# Patient Record
Sex: Female | Born: 1940 | Race: White | Hispanic: No | Smoking: Never smoker
Health system: Southern US, Community
[De-identification: ages and names within clinical notes are randomized; demographics above are authoritative.]

## PROBLEM LIST (undated history)

## (undated) DIAGNOSIS — M1990 Unspecified osteoarthritis, unspecified site: Secondary | ICD-10-CM

## (undated) DIAGNOSIS — C801 Malignant (primary) neoplasm, unspecified: Secondary | ICD-10-CM

## (undated) DIAGNOSIS — E079 Disorder of thyroid, unspecified: Secondary | ICD-10-CM

## (undated) DIAGNOSIS — J449 Chronic obstructive pulmonary disease, unspecified: Secondary | ICD-10-CM

## (undated) HISTORY — PX: CHOLECYSTECTOMY: SHX55

## (undated) HISTORY — PX: BREAST LUMPECTOMY: SHX2

## (undated) HISTORY — DX: Unspecified osteoarthritis, unspecified site: M19.90

## (undated) HISTORY — DX: Chronic obstructive pulmonary disease, unspecified: J44.9

## (undated) HISTORY — PX: COLON SURGERY: SHX602

## (undated) HISTORY — DX: Disorder of thyroid, unspecified: E07.9

## (undated) HISTORY — DX: Malignant (primary) neoplasm, unspecified: C80.1

## (undated) HISTORY — PX: SPINE SURGERY: SHX786

---

## 2004-03-14 ENCOUNTER — Inpatient Hospital Stay: Payer: Self-pay | Admitting: Surgery

## 2004-04-06 ENCOUNTER — Ambulatory Visit: Payer: Self-pay | Admitting: Radiation Oncology

## 2004-04-07 ENCOUNTER — Ambulatory Visit: Payer: Self-pay | Admitting: Radiation Oncology

## 2004-04-19 ENCOUNTER — Ambulatory Visit: Payer: Self-pay | Admitting: Surgery

## 2004-05-04 ENCOUNTER — Ambulatory Visit: Payer: Self-pay | Admitting: Radiation Oncology

## 2004-06-04 ENCOUNTER — Ambulatory Visit: Payer: Self-pay | Admitting: Radiation Oncology

## 2004-07-05 ENCOUNTER — Ambulatory Visit: Payer: Self-pay | Admitting: Radiation Oncology

## 2004-08-02 ENCOUNTER — Ambulatory Visit: Payer: Self-pay | Admitting: Radiation Oncology

## 2004-09-02 ENCOUNTER — Ambulatory Visit: Payer: Self-pay | Admitting: Radiation Oncology

## 2004-10-02 ENCOUNTER — Ambulatory Visit: Payer: Self-pay | Admitting: Radiation Oncology

## 2004-11-02 ENCOUNTER — Ambulatory Visit: Payer: Self-pay | Admitting: Radiation Oncology

## 2004-12-02 ENCOUNTER — Ambulatory Visit: Payer: Self-pay | Admitting: Radiation Oncology

## 2005-01-02 ENCOUNTER — Ambulatory Visit: Payer: Self-pay | Admitting: Radiation Oncology

## 2005-02-02 ENCOUNTER — Ambulatory Visit: Payer: Self-pay | Admitting: Internal Medicine

## 2005-03-04 ENCOUNTER — Ambulatory Visit: Payer: Self-pay | Admitting: Internal Medicine

## 2005-04-04 ENCOUNTER — Ambulatory Visit: Payer: Self-pay | Admitting: Internal Medicine

## 2005-05-10 ENCOUNTER — Ambulatory Visit: Payer: Self-pay | Admitting: Internal Medicine

## 2005-06-04 ENCOUNTER — Ambulatory Visit: Payer: Self-pay | Admitting: Internal Medicine

## 2005-07-05 ENCOUNTER — Ambulatory Visit: Payer: Self-pay | Admitting: Internal Medicine

## 2005-08-02 ENCOUNTER — Ambulatory Visit: Payer: Self-pay | Admitting: Internal Medicine

## 2005-08-22 ENCOUNTER — Ambulatory Visit: Payer: Self-pay | Admitting: Unknown Physician Specialty

## 2005-09-02 ENCOUNTER — Ambulatory Visit: Payer: Self-pay | Admitting: Internal Medicine

## 2005-10-04 ENCOUNTER — Ambulatory Visit: Payer: Self-pay | Admitting: Internal Medicine

## 2005-11-02 ENCOUNTER — Ambulatory Visit: Payer: Self-pay | Admitting: Internal Medicine

## 2005-12-02 ENCOUNTER — Ambulatory Visit: Payer: Self-pay | Admitting: Internal Medicine

## 2006-01-02 ENCOUNTER — Ambulatory Visit: Payer: Self-pay | Admitting: Internal Medicine

## 2006-02-02 ENCOUNTER — Ambulatory Visit: Payer: Self-pay | Admitting: Internal Medicine

## 2006-03-04 ENCOUNTER — Ambulatory Visit: Payer: Self-pay | Admitting: Internal Medicine

## 2006-04-04 ENCOUNTER — Ambulatory Visit: Payer: Self-pay | Admitting: Internal Medicine

## 2006-05-04 ENCOUNTER — Ambulatory Visit: Payer: Self-pay | Admitting: Internal Medicine

## 2006-06-04 ENCOUNTER — Ambulatory Visit: Payer: Self-pay | Admitting: Internal Medicine

## 2006-07-05 ENCOUNTER — Ambulatory Visit: Payer: Self-pay | Admitting: Internal Medicine

## 2006-08-03 ENCOUNTER — Ambulatory Visit: Payer: Self-pay | Admitting: Internal Medicine

## 2006-09-03 ENCOUNTER — Ambulatory Visit: Payer: Self-pay | Admitting: Internal Medicine

## 2006-09-25 ENCOUNTER — Ambulatory Visit: Payer: Self-pay | Admitting: Internal Medicine

## 2006-10-03 ENCOUNTER — Ambulatory Visit: Payer: Self-pay | Admitting: Internal Medicine

## 2006-11-03 ENCOUNTER — Ambulatory Visit: Payer: Self-pay | Admitting: Internal Medicine

## 2006-12-03 ENCOUNTER — Ambulatory Visit: Payer: Self-pay | Admitting: Internal Medicine

## 2006-12-25 ENCOUNTER — Ambulatory Visit: Payer: Self-pay | Admitting: Internal Medicine

## 2007-01-03 ENCOUNTER — Ambulatory Visit: Payer: Self-pay | Admitting: Internal Medicine

## 2007-02-03 ENCOUNTER — Ambulatory Visit: Payer: Self-pay | Admitting: Internal Medicine

## 2007-03-05 ENCOUNTER — Ambulatory Visit: Payer: Self-pay | Admitting: Internal Medicine

## 2007-04-05 ENCOUNTER — Ambulatory Visit: Payer: Self-pay | Admitting: Internal Medicine

## 2007-05-05 ENCOUNTER — Ambulatory Visit: Payer: Self-pay | Admitting: Internal Medicine

## 2007-06-05 ENCOUNTER — Ambulatory Visit: Payer: Self-pay | Admitting: Internal Medicine

## 2007-07-06 ENCOUNTER — Ambulatory Visit: Payer: Self-pay | Admitting: Internal Medicine

## 2007-08-03 ENCOUNTER — Ambulatory Visit: Payer: Self-pay | Admitting: Internal Medicine

## 2007-08-28 ENCOUNTER — Ambulatory Visit: Payer: Self-pay | Admitting: Unknown Physician Specialty

## 2007-09-03 ENCOUNTER — Ambulatory Visit: Payer: Self-pay | Admitting: Internal Medicine

## 2007-09-16 ENCOUNTER — Ambulatory Visit: Payer: Self-pay | Admitting: Internal Medicine

## 2007-10-03 ENCOUNTER — Ambulatory Visit: Payer: Self-pay | Admitting: Internal Medicine

## 2007-11-03 ENCOUNTER — Ambulatory Visit: Payer: Self-pay | Admitting: Internal Medicine

## 2007-12-03 ENCOUNTER — Ambulatory Visit: Payer: Self-pay | Admitting: Internal Medicine

## 2008-01-03 ENCOUNTER — Ambulatory Visit: Payer: Self-pay | Admitting: Internal Medicine

## 2008-02-03 ENCOUNTER — Ambulatory Visit: Payer: Self-pay | Admitting: Internal Medicine

## 2008-03-04 ENCOUNTER — Ambulatory Visit: Payer: Self-pay | Admitting: Internal Medicine

## 2008-04-04 ENCOUNTER — Ambulatory Visit: Payer: Self-pay | Admitting: Internal Medicine

## 2008-05-04 ENCOUNTER — Ambulatory Visit: Payer: Self-pay | Admitting: Internal Medicine

## 2008-06-04 ENCOUNTER — Ambulatory Visit: Payer: Self-pay | Admitting: Internal Medicine

## 2008-07-05 ENCOUNTER — Ambulatory Visit: Payer: Self-pay | Admitting: Internal Medicine

## 2008-08-02 ENCOUNTER — Ambulatory Visit: Payer: Self-pay | Admitting: Internal Medicine

## 2008-09-02 ENCOUNTER — Ambulatory Visit: Payer: Self-pay | Admitting: Internal Medicine

## 2008-10-02 ENCOUNTER — Ambulatory Visit: Payer: Self-pay | Admitting: Internal Medicine

## 2008-12-29 IMAGING — CR DG CHEST 2V
1 series · 2 of 2 positions shown · non-contrast
Comparison: none

REASON FOR EXAM: Ascending colon
COMMENTS:

[Series 1: view not recorded · 0.17mm/px · 2 of 2 slices shown]
[im 1/2]
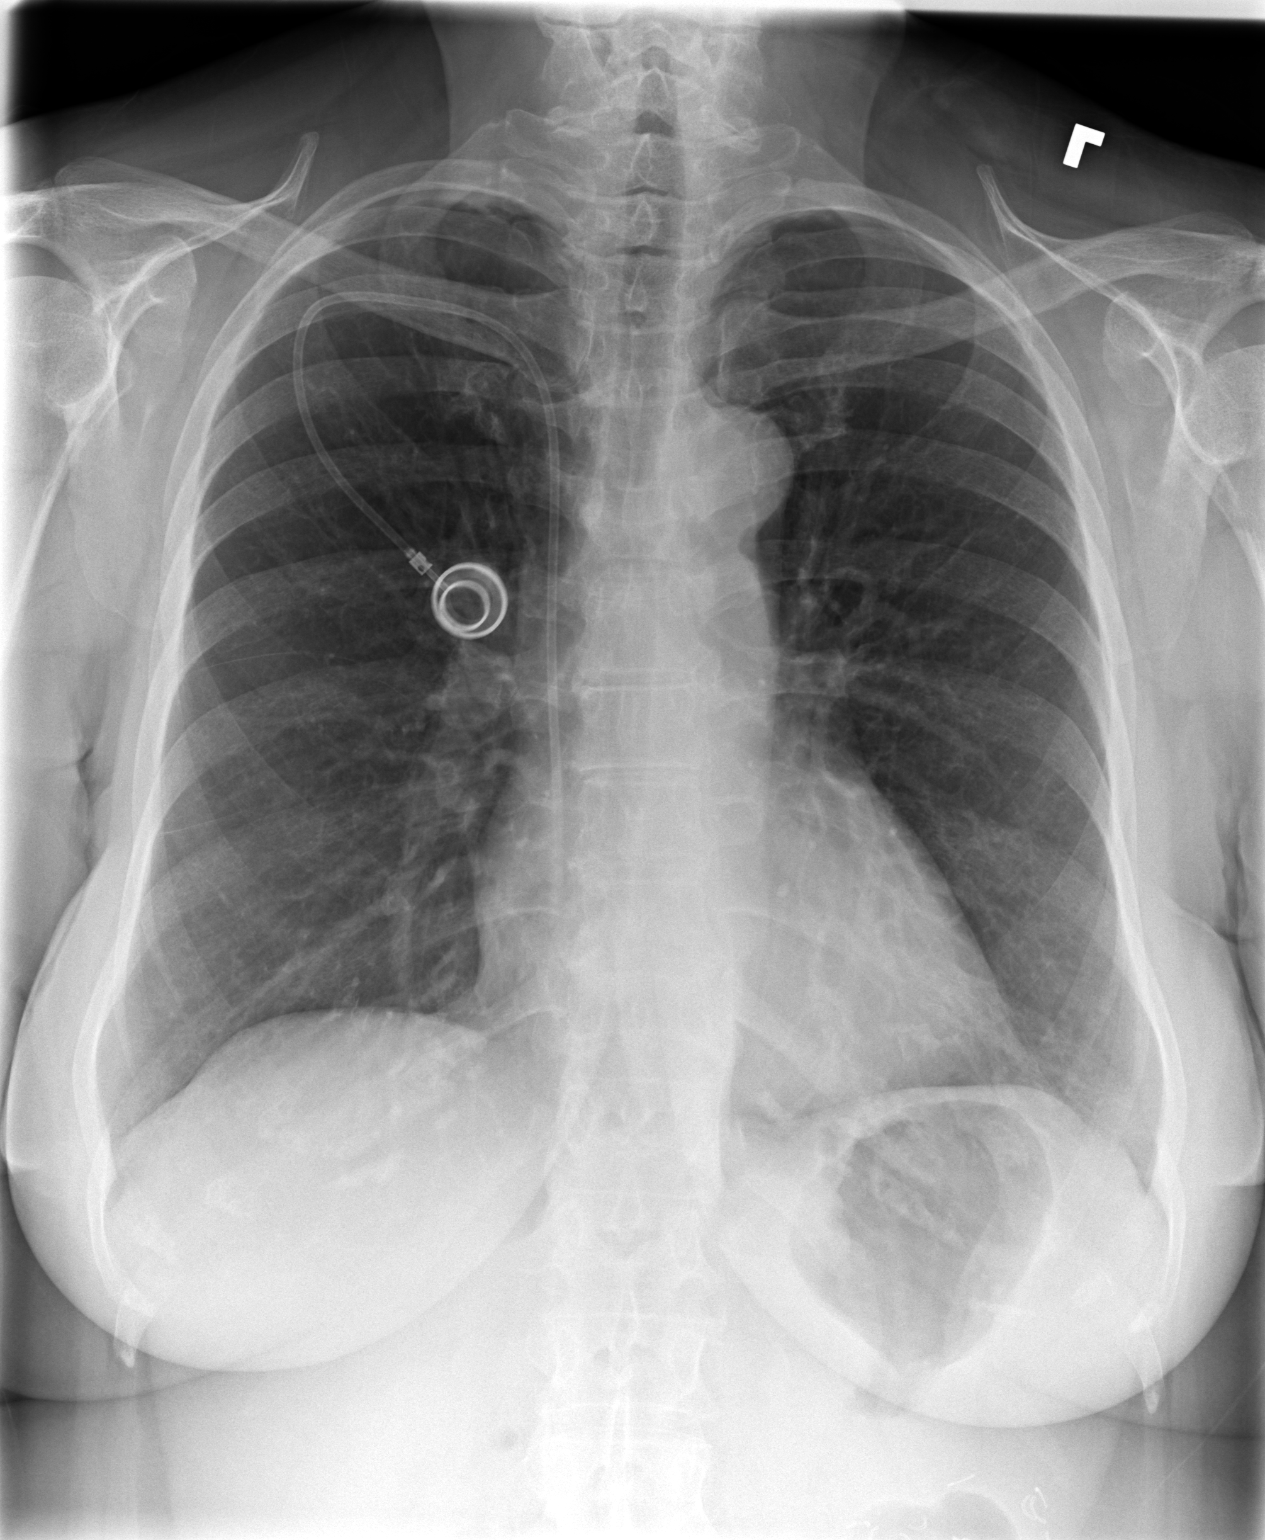
[im 2/2]
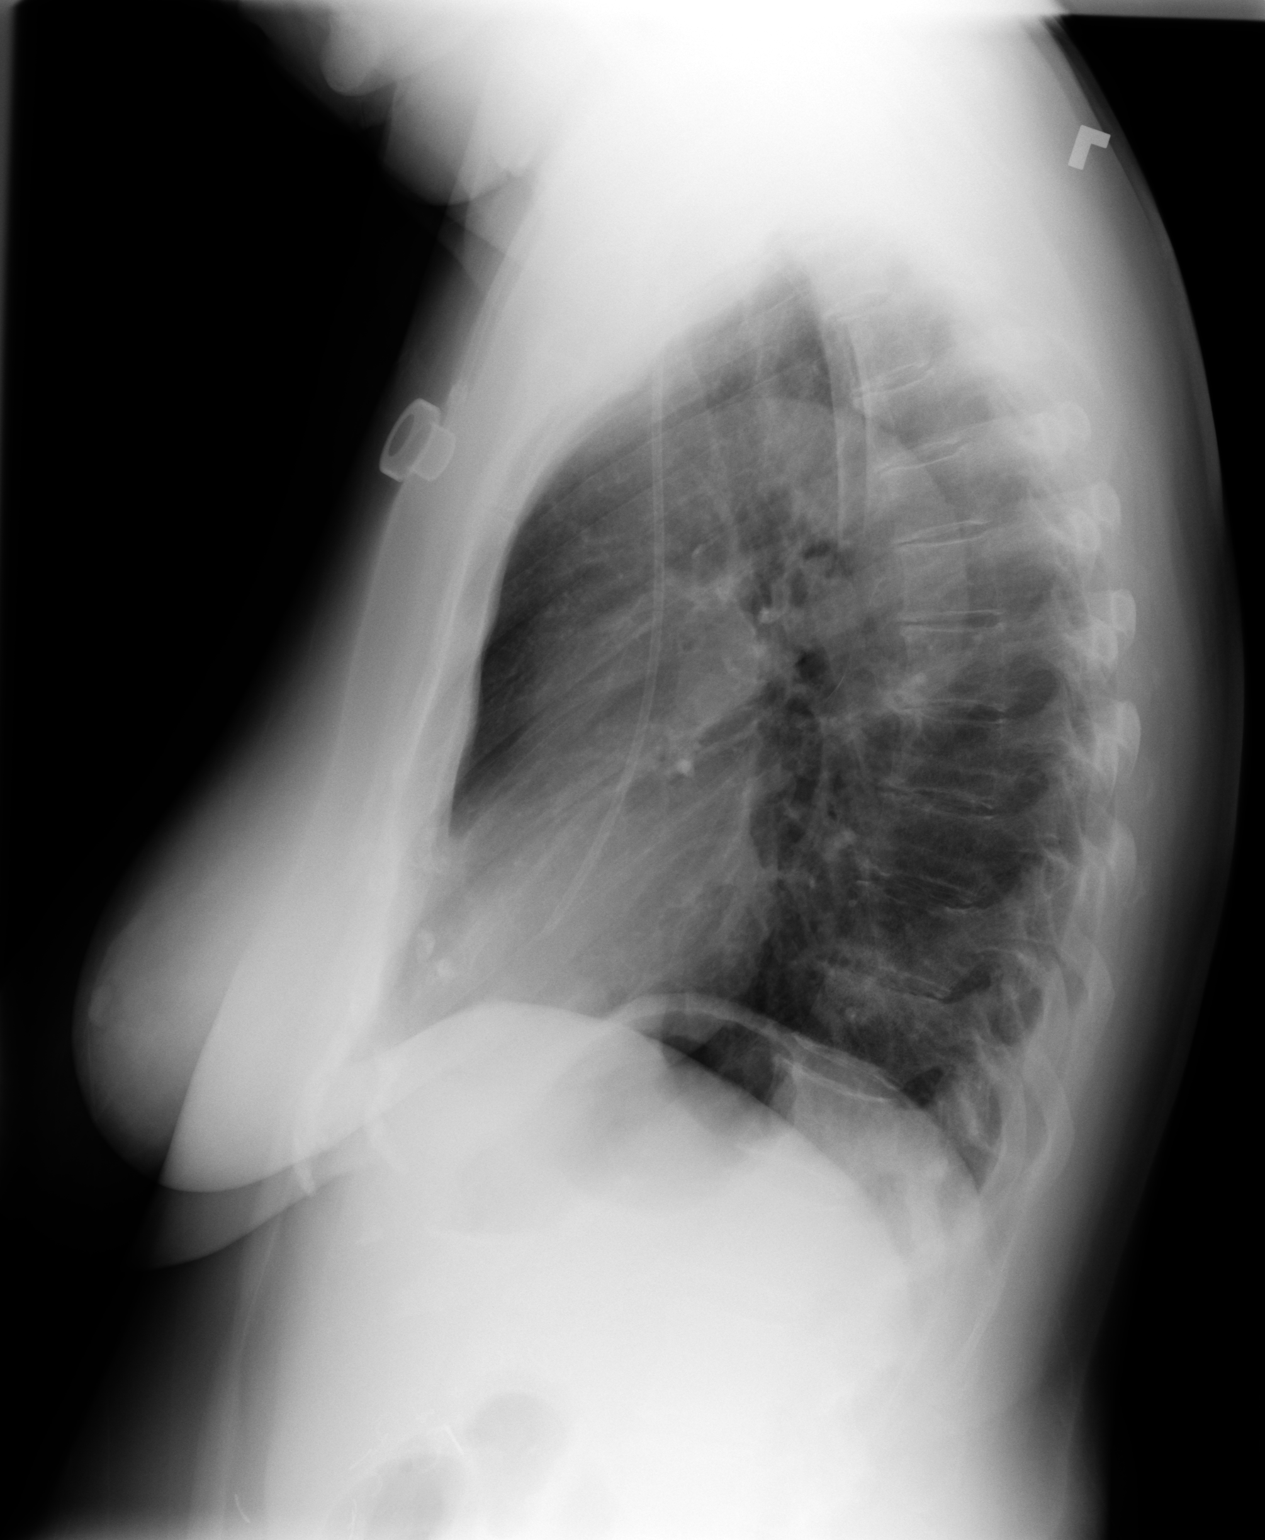

[2 of 2 positions shown; findings below may reference images not displayed]

PROCEDURE:     MDR - MDR CHEST PA(OR AP) AND LATERAL  - July 23, 2007  [DATE]

RESULT:     Comparison is made to the prior exam of 01/22/2007.

The lung fields are clear. No pneumonia, pneumothorax or pleural effusion is
seen. No findings suspicious for metastatic disease are identified. The
heart size is normal. No acute bony abnormalities are seen. A Port-A-Cath is
present.
IMPRESSION: 1.  No acute changes are identified.
2.  No findings suspicious for metastatic disease are seen.
3.  A Port-A-Cath is present.

## 2009-07-07 ENCOUNTER — Ambulatory Visit: Payer: Self-pay | Admitting: Family Medicine

## 2009-10-12 IMAGING — CR DG CHEST 2V
1 series · 2 of 2 positions shown · non-contrast
Comparison: none

REASON FOR EXAM: malignant neoplasm of ascending colon
COMMENTS:

[Series 1: view not recorded · 0.17mm/px · 2 of 2 slices shown]
[im 1/2]
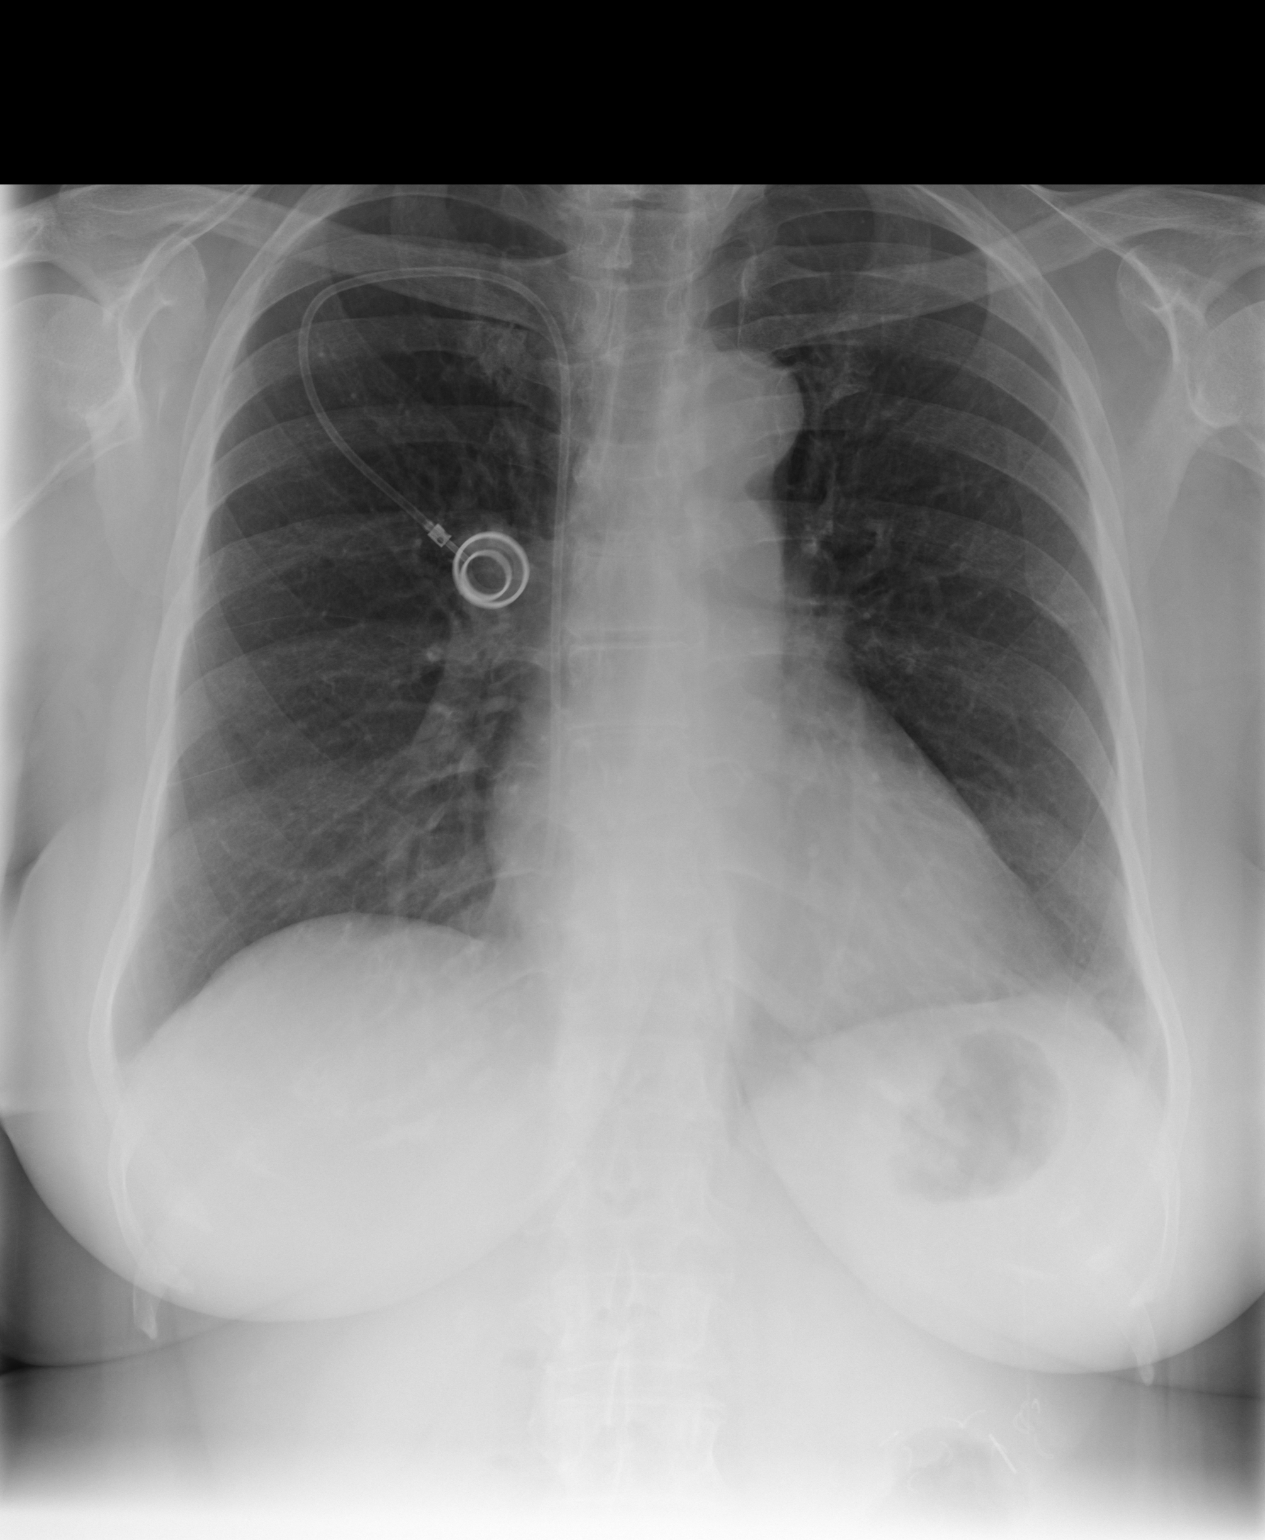
[im 2/2]
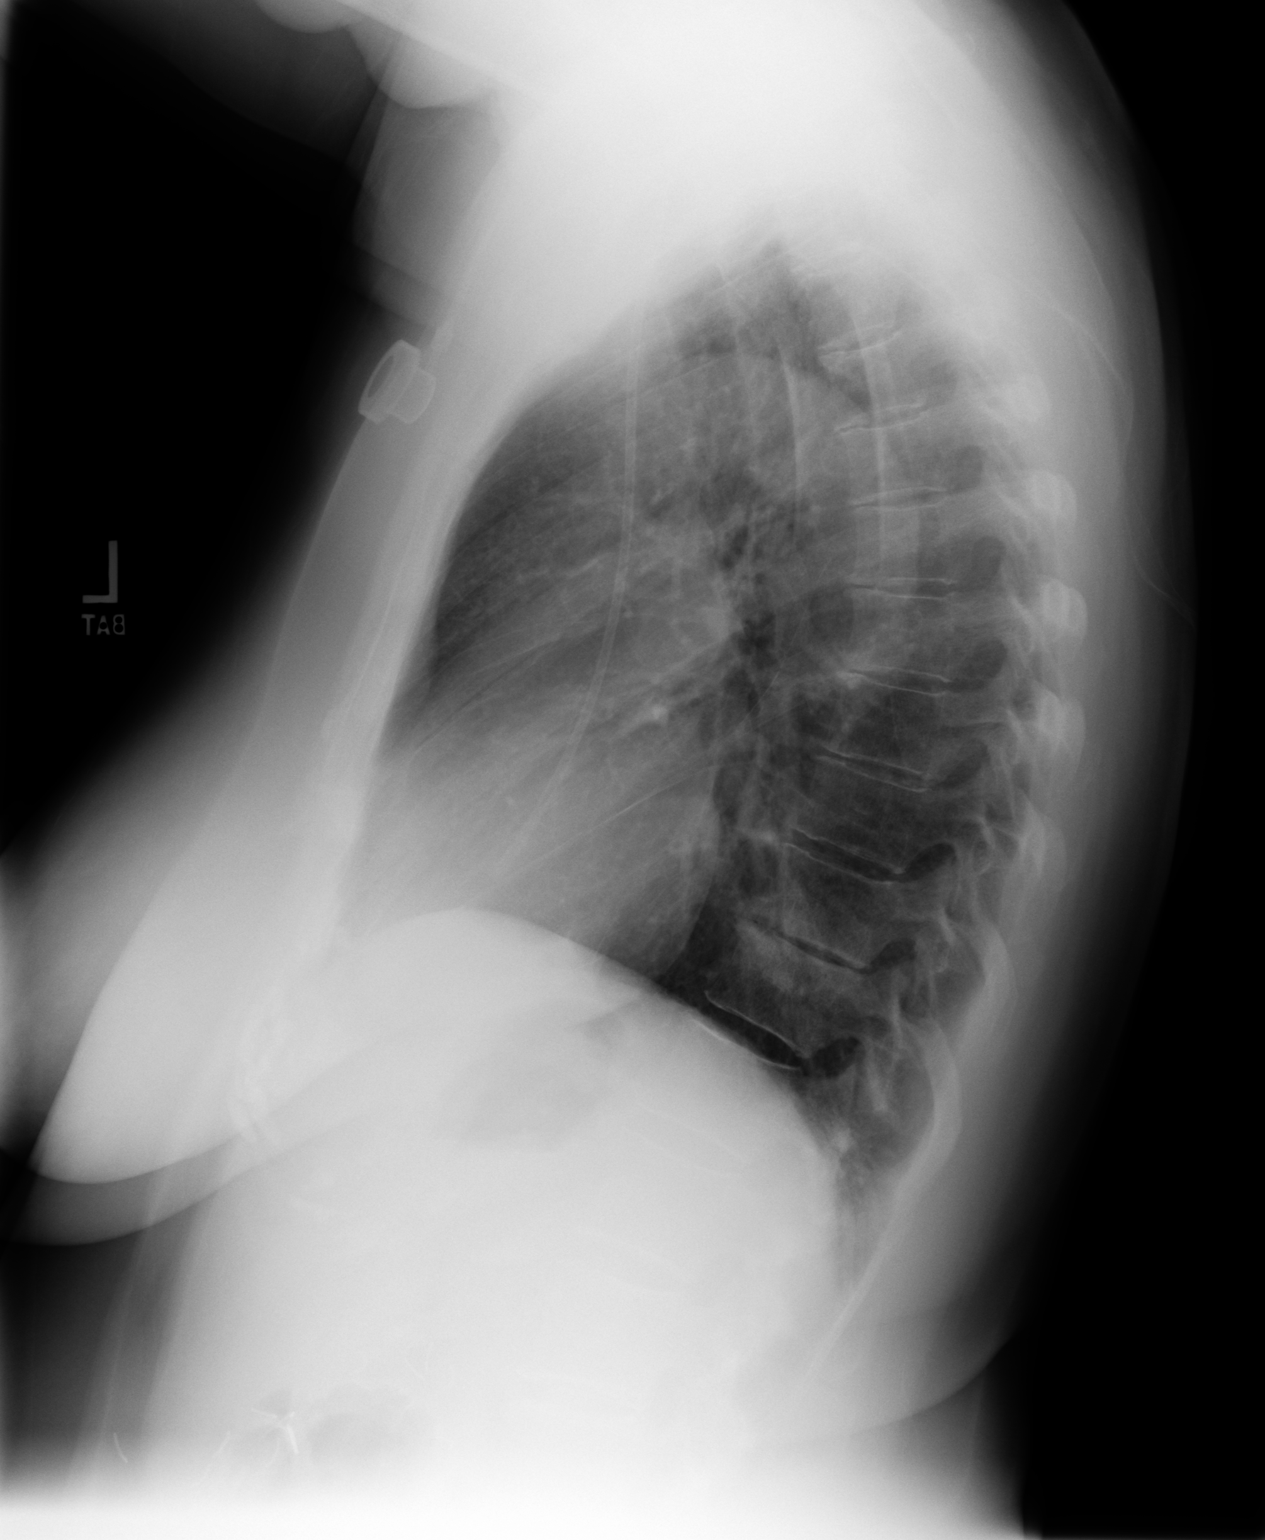

[2 of 2 positions shown; findings below may reference images not displayed]

PROCEDURE:     MDR - MDR CHEST PA(OR AP) AND LATERAL  - May 05, 2008 [DATE]

RESULT:     Comparison is made to a prior exam of 07/23/2007. A Port-A-Cath
is again observed. The lung fields are clear. No pneumonia, pneumothorax or
pleural effusion is seen. No finding suspicious for metastatic disease are
noted. Heart size is normal.
IMPRESSION: 1. Stable appearing chest as compared to the prior exam of 07/23/2007.
[DATE]. The lung fields are clear.
3. A Port-A-Cath is again observed.

## 2020-09-20 ENCOUNTER — Other Ambulatory Visit: Payer: Self-pay

## 2020-09-20 ENCOUNTER — Ambulatory Visit (INDEPENDENT_AMBULATORY_CARE_PROVIDER_SITE_OTHER): Payer: Medicare FFS

## 2020-09-20 ENCOUNTER — Ambulatory Visit: Payer: Medicare FFS | Admitting: Podiatry

## 2020-09-20 DIAGNOSIS — L853 Xerosis cutis: Secondary | ICD-10-CM

## 2020-09-20 DIAGNOSIS — S9030XA Contusion of unspecified foot, initial encounter: Secondary | ICD-10-CM

## 2020-09-20 DIAGNOSIS — M79671 Pain in right foot: Secondary | ICD-10-CM

## 2020-09-20 DIAGNOSIS — M76821 Posterior tibial tendinitis, right leg: Secondary | ICD-10-CM

## 2020-09-27 DIAGNOSIS — M76821 Posterior tibial tendinitis, right leg: Secondary | ICD-10-CM | POA: Diagnosis not present

## 2020-09-27 MED ORDER — BETAMETHASONE SOD PHOS & ACET 6 (3-3) MG/ML IJ SUSP
3.0000 mg | Freq: Once | INTRAMUSCULAR | Status: AC
  Administered 2020-09-27: 3 mg via INTRA_ARTICULAR

## 2020-09-27 NOTE — Progress Notes (Signed)
   HPI: 80 y.o. female presenting today as a new patient for evaluation of right ankle pain this been going on for several months.  Sudden onset.  She cannot recall an incident or injury that would have elicited her pain.  She states that her ankle falls out and it constantly aches when pressure is applied.  Patient is also concerned for dry skin to her left foot specifically.  She states that her skin is very dry and it peels at times.  She denies any itching.  She presents for further treatment and evaluation  No past medical history on file.   Physical Exam: General: The patient is alert and oriented x3 in no acute distress.  Dermatology: Skin is warm, dry and supple bilateral lower extremities. Negative for open lesions or macerations.  Xerosis of skin noted left foot  Vascular: Palpable pedal pulses bilaterally. No edema or erythema noted. Capillary refill within normal limits.  Neurological: Epicritic and protective threshold grossly intact bilaterally.   Musculoskeletal Exam: Range of motion within normal limits to all pedal and ankle joints bilateral. Muscle strength 5/5 in all groups bilateral.  There is some pain on palpation along the posterior tibial tendon right lower extremity as it inserts onto the navicular tuberosity  Radiographic Exam:  Normal osseous mineralization. Joint spaces preserved. No fracture/dislocation/boney destruction.    Assessment: 1.  Insertional posterior tibial tendinitis right 2.  Xerosis of skin left foot   Plan of Care:  1. Patient evaluated. X-Rays reviewed.  2.  Injection of 0.5 cc Celestone Soluspan injected along the posterior tibial tendon sheath right lower extremity 3.  Recommend OTC AmLactin lotion available at any pharmacy 4.  Return to clinic as needed      Felecia Shelling, DPM [facility]  Dr. Felecia Shelling, DPM    [address] [place], [place] [address]                 Office [phone]  Fax [phone]

## 2022-10-19 ENCOUNTER — Ambulatory Visit: Payer: Medicare HMO | Admitting: Podiatry

## 2022-10-19 DIAGNOSIS — L309 Dermatitis, unspecified: Secondary | ICD-10-CM

## 2022-10-19 DIAGNOSIS — I739 Peripheral vascular disease, unspecified: Secondary | ICD-10-CM | POA: Diagnosis not present

## 2022-10-19 MED ORDER — CLOTRIMAZOLE-BETAMETHASONE 1-0.05 % EX CREA: 1.0000 | TOPICAL_CREAM | Freq: Every day | CUTANEOUS | 1 refills | Status: DC

## 2022-10-19 NOTE — Progress Notes (Signed)
   Chief Complaint  Patient presents with   Rash    Left foot rash started 4 months ago when she got covid,     HPI: 82 y.o. female presenting today for new complaint of pain and tenderness associated to the left foot and ankle.  Patient states that about 4 months ago she had COVID and subsequently began to develop an itchy rash to the left foot specifically with peeling skin.  Idiopathic onset.  Presenting for further treatment and evaluation  No past medical history on file.  Allergies  Allergen Reactions   Dexamethasone Other (See Comments)   Codeine Rash    LT foot and ankle 10/19/2022  Physical Exam: General: The patient is alert and oriented x3 in no acute distress.  Dermatology: Inflammatory dermatitis with peeling skin noted throughout the left foot extending up into the lateral aspect of the ankle.  Please see above noted photo.  No full-thickness wounds noted  Vascular: Skin is cool to touch.  Purple dusky discoloration noted to the digits of the left foot compared to the contralateral limb.  Unable to palpate pedal pulses left lower extremity.  Delayed capillary refill  Neurological: Grossly intact via light touch  Musculoskeletal Exam: No pedal deformities noted  Assessment/Plan of Care: 1.  Inflammatory dermatitis left 2.  Concern for possible vascular compromise/critical limb ischemia left  -Patient evaluated. -Order placed for ABIs bilateral lower extremities to establish a baseline -Prescription for Lotrisone cream apply 2 times daily -Return to clinic 4 weeks to review ABIs and reevaluate the inflammatory skin changes of her foot       Felecia Shelling, DPM [facility]  Dr. Felecia Shelling, DPM    [address] [place], [place] [address]                Office [phone]  Fax [phone]

## 2022-10-30 ENCOUNTER — Encounter: Payer: Self-pay | Admitting: Podiatry

## 2022-11-02 ENCOUNTER — Ambulatory Visit (INDEPENDENT_AMBULATORY_CARE_PROVIDER_SITE_OTHER): Payer: Medicare HMO

## 2022-11-02 DIAGNOSIS — I739 Peripheral vascular disease, unspecified: Secondary | ICD-10-CM

## 2022-11-05 LAB — VAS US ABI WITH/WO TBI
Left ABI: 0.8
Right ABI: 1.06

## 2022-11-06 ENCOUNTER — Other Ambulatory Visit: Payer: Self-pay | Admitting: Podiatry

## 2022-11-06 DIAGNOSIS — I739 Peripheral vascular disease, unspecified: Secondary | ICD-10-CM

## 2022-11-06 NOTE — Progress Notes (Signed)
Referral placed to [facility].  Abnormal ABIs.  Ischemic rest pain.  Felecia Shelling, DPM [facility]  Dr. Felecia Shelling, DPM    [address] [place], [place] [address]                Office [phone]  Fax [phone]

## 2022-11-14 ENCOUNTER — Encounter: Payer: Self-pay | Admitting: Podiatry

## 2022-11-14 ENCOUNTER — Other Ambulatory Visit: Payer: Self-pay | Admitting: Podiatry

## 2022-11-14 MED ORDER — CLOTRIMAZOLE-BETAMETHASONE 1-0.05 % EX CREA: 1.0000 | TOPICAL_CREAM | Freq: Every day | CUTANEOUS | 1 refills | Status: AC

## 2022-11-20 ENCOUNTER — Ambulatory Visit: Payer: Medicare HMO | Admitting: Podiatry

## 2022-12-20 ENCOUNTER — Ambulatory Visit (INDEPENDENT_AMBULATORY_CARE_PROVIDER_SITE_OTHER): Payer: Medicare HMO | Admitting: Vascular Surgery

## 2022-12-20 ENCOUNTER — Encounter (INDEPENDENT_AMBULATORY_CARE_PROVIDER_SITE_OTHER): Payer: Self-pay | Admitting: Vascular Surgery

## 2022-12-20 VITALS — BP 113/75 | HR 80 | Resp 18 | Ht 63.0 in | Wt 160.6 lb

## 2022-12-20 DIAGNOSIS — I70213 Atherosclerosis of native arteries of extremities with intermittent claudication, bilateral legs: Secondary | ICD-10-CM | POA: Diagnosis not present

## 2022-12-20 DIAGNOSIS — E039 Hypothyroidism, unspecified: Secondary | ICD-10-CM

## 2022-12-20 DIAGNOSIS — J449 Chronic obstructive pulmonary disease, unspecified: Secondary | ICD-10-CM | POA: Diagnosis not present

## 2022-12-20 NOTE — Progress Notes (Unsigned)
MRN : [id number]  Linda Mills is a 82 y.o. (22-Feb-1941) female who presents with chief complaint of check circulation.  History of Present Illness: ***  Current Meds  Medication Sig   calcium elemental as carbonate (BARIATRIC TUMS ULTRA) 400 MG chewable tablet Chew by mouth.   clotrimazole-betamethasone (LOTRISONE) cream Apply 1 Application topically daily.   diphenhydrAMINE (BENADRYL) 25 mg capsule Take by mouth.   hydroxychloroquine (PLAQUENIL) 200 MG tablet Take 1 tablet by mouth daily.   levothyroxine (SYNTHROID) 75 MCG tablet Take by mouth.   loperamide (IMODIUM A-D) 2 MG tablet Take by mouth.   tiotropium (SPIRIVA HANDIHALER) 18 MCG inhalation capsule INHALE 1 CAPSULE VIA HANDIHALER ONCE DAILY AT THE SAME TIME EVERY DAY    Past Medical History:  Diagnosis Date   Arthritis    Cancer (HCC)    COPD (chronic obstructive pulmonary disease) (HCC)    Thyroid disease     *** The histories are not reviewed yet. Please review them in the "History" navigator section and refresh this SmartLink.  Social History    Family History Family History  Problem Relation Age of Onset   Diabetes Father    Diabetes Sister    Diabetes Brother     Allergies  Allergen Reactions   Sulfa Antibiotics Hives and Rash   Corticosteroids Other (See Comments) and Rash    Stephen/johnson syn; Noxious smell to body.  Pt: I think they just gave me too much at once.  Pt has tolerated steroid injections to shoulder and hip prior and since.   Dexamethasone Other (See Comments)   Codeine Rash     REVIEW OF SYSTEMS (Negative unless checked)  Constitutional: [] Weight loss  [] Fever  [] Chills Cardiac: [] Chest pain   [] Chest pressure   [] Palpitations   [] Shortness of breath when laying flat   [] Shortness of breath with exertion. Vascular:  [x] Pain in legs with walking   [] Pain in legs at rest  [] History of DVT   [] Phlebitis    [] Swelling in legs   [] Varicose veins   [] Non-healing ulcers Pulmonary:   [] Uses home oxygen   [] Productive cough   [] Hemoptysis   [] Wheeze  [] COPD   [] Asthma Neurologic:  [] Dizziness   [] Seizures   [] History of stroke   [] History of TIA  [] Aphasia   [] Vissual changes   [] Weakness or numbness in arm   [] Weakness or numbness in leg Musculoskeletal:   [] Joint swelling   [] Joint pain   [] Low back pain Hematologic:  [] Easy bruising  [] Easy bleeding   [] Hypercoagulable state   [] Anemic Gastrointestinal:  [] Diarrhea   [] Vomiting  [] Gastroesophageal reflux/heartburn   [] Difficulty swallowing. Genitourinary:  [] Chronic kidney disease   [] Difficult urination  [] Frequent urination   [] Blood in urine Skin:  [] Rashes   [] Ulcers  Psychological:  [] History of anxiety   []  History of major depression.  Physical Examination  Vitals:   12/20/22 1055  BP: 113/75  Pulse: 80  Resp: 18  Weight: 160 lb 9.6 oz (72.8 kg)  Height: 5\' 3"  (1.6 m)   Body mass index is 28.45 kg/m. Gen: WD/WN, NAD  Head: [place]/AT, No temporalis wasting.  Ear/Nose/Throat: Hearing grossly intact, nares w/o erythema or drainage Eyes: PER, EOMI, sclera nonicteric.  Neck: Supple, no masses.  No bruit or JVD.  Pulmonary:  Good air movement, no audible wheezing, no use of accessory muscles.  Cardiac: RRR, normal S1, S2, no Murmurs. Vascular:  mild trophic changes, no open wounds Vessel Right Left  Radial Palpable Palpable  PT Not Palpable Not Palpable  DP Not Palpable Not Palpable  Gastrointestinal: soft, non-distended. No guarding/no peritoneal signs.  Musculoskeletal: M/S 5/5 throughout.  No visible deformity.  Neurologic: CN 2-12 intact. Pain and light touch intact in extremities.  Symmetrical.  Speech is fluent. Motor exam as listed above. Psychiatric: Judgment intact, Mood & affect appropriate for pt's clinical situation. Dermatologic: No rashes or ulcers noted.  No changes consistent with cellulitis.   CBC No results found  for: "WBC", "HGB", "HCT", "MCV", "PLT"  BMET No results found for: "NA", "K", "CL", "CO2", "GLUCOSE", "BUN", "CREATININE", "CALCIUM", "GFRNONAA", "GFRAA" CrCl cannot be calculated (No successful lab value found.).  COAG No results found for: "INR", "PROTIME"  Radiology No results found.   Assessment/Plan There are no diagnoses linked to this encounter.   Levora Dredge, MD  12/20/2022 11:00 AM

## 2022-12-26 ENCOUNTER — Encounter (INDEPENDENT_AMBULATORY_CARE_PROVIDER_SITE_OTHER): Payer: Self-pay | Admitting: Vascular Surgery

## 2022-12-26 DIAGNOSIS — E039 Hypothyroidism, unspecified: Secondary | ICD-10-CM | POA: Insufficient documentation

## 2022-12-26 DIAGNOSIS — J449 Chronic obstructive pulmonary disease, unspecified: Secondary | ICD-10-CM | POA: Insufficient documentation

## 2022-12-26 DIAGNOSIS — I70219 Atherosclerosis of native arteries of extremities with intermittent claudication, unspecified extremity: Secondary | ICD-10-CM | POA: Insufficient documentation

## 2023-02-22 ENCOUNTER — Encounter: Payer: Self-pay | Admitting: Podiatry

## 2023-02-22 ENCOUNTER — Ambulatory Visit: Payer: Medicare HMO | Admitting: Podiatry

## 2023-02-22 VITALS — BP 146/71 | HR 82

## 2023-02-22 DIAGNOSIS — L309 Dermatitis, unspecified: Secondary | ICD-10-CM

## 2023-02-22 MED ORDER — TRIAMCINOLONE ACETONIDE 0.1 % EX CREA: 1.0000 | TOPICAL_CREAM | Freq: Two times a day (BID) | CUTANEOUS | 1 refills | Status: AC

## 2023-02-22 NOTE — Progress Notes (Signed)
   Chief Complaint  Patient presents with   Tinea Pedis    "I've got this red rash.  I've used three tubes of medicine.  I want to know if I need to keep seeing him or do I need to go to a Dermatologist."    HPI: 82 y.o. female presenting today for follow-up evaluation of dermatitis to the left foot.  History of COVID and subsequently began to develop an itchy rash to the left foot specifically with peeling skin.  Idiopathic onset.  She has been applying Lotrisone cream which was prescribed last visit on 10/19/2022.  Initial she says that it helped significantly but there has been no improvement recently presenting for further treatment and evaluation  Past Medical History:  Diagnosis Date   Arthritis    Cancer (HCC)    COPD (chronic obstructive pulmonary disease) (HCC)    Thyroid disease     Allergies  Allergen Reactions   Sulfa Antibiotics Hives and Rash   Corticosteroids Other (See Comments) and Rash    Stephen/johnson syn; Noxious smell to body.  Pt: I think they just gave me too much at once.  Pt has tolerated steroid injections to shoulder and hip prior and since.   Dexamethasone Other (See Comments)   Codeine Rash    LT foot and ankle 10/19/2022   LT foot 02/22/2023  Physical Exam: General: The patient is alert and oriented x3 in no acute distress.  Dermatology: Inflammatory dermatitis with peeling skin noted throughout the left foot extending up into the lateral aspect of the ankle.  Please see above noted photo.  No full-thickness wounds noted  Vascular: Skin is cool to touch.  Patient continues to have persistent dermatitis to the left foot.  She says that originally the Lotrisone cream helped however lately it has not been effective in alleviating her rash to the left foot.  Neurological: Grossly intact via light touch  Musculoskeletal Exam: No pedal deformities noted  Assessment/Plan of Care: 1.  Inflammatory dermatitis left  -Patient evaluated. - Prescription  for triamcinolone 0.1% cream twice daily -Patient has an appointment with dermatology 04/18/2023.  Their input would be greatly appreciated -Continue good foot hygiene -Return to clinic as needed       Felecia Shelling, DPM [facility]  Dr. Felecia Shelling, DPM    [address] [place], [place] [address]                Office [phone]  Fax [phone]

## 2023-10-22 ENCOUNTER — Encounter (INDEPENDENT_AMBULATORY_CARE_PROVIDER_SITE_OTHER): Payer: Self-pay

## 2023-12-16 ENCOUNTER — Ambulatory Visit (INDEPENDENT_AMBULATORY_CARE_PROVIDER_SITE_OTHER): Payer: Medicare HMO | Admitting: Vascular Surgery
